# Patient Record
Sex: Female | Born: 1975 | Race: White | Hispanic: No | Marital: Married | Smoking: Never smoker
Health system: Southern US, Community
[De-identification: ages and names within clinical notes are randomized; demographics above are authoritative.]

## PROBLEM LIST (undated history)

## (undated) DIAGNOSIS — I10 Essential (primary) hypertension: Secondary | ICD-10-CM

## (undated) DIAGNOSIS — G2581 Restless legs syndrome: Secondary | ICD-10-CM

## (undated) DIAGNOSIS — M5020 Other cervical disc displacement, unspecified cervical region: Secondary | ICD-10-CM

## (undated) DIAGNOSIS — J45909 Unspecified asthma, uncomplicated: Secondary | ICD-10-CM

---

## 1998-09-14 ENCOUNTER — Inpatient Hospital Stay (HOSPITAL_COMMUNITY): Admission: EM | Admit: 1998-09-14 | Discharge: 1998-09-17 | Payer: Self-pay | Admitting: Urology

## 1998-09-14 ENCOUNTER — Encounter: Payer: Self-pay | Admitting: Urology

## 2000-07-10 ENCOUNTER — Other Ambulatory Visit: Admission: RE | Admit: 2000-07-10 | Discharge: 2000-07-10 | Payer: Self-pay | Admitting: *Deleted

## 2001-08-13 ENCOUNTER — Other Ambulatory Visit: Admission: RE | Admit: 2001-08-13 | Discharge: 2001-08-13 | Payer: Self-pay | Admitting: *Deleted

## 2002-07-29 ENCOUNTER — Encounter: Admission: RE | Admit: 2002-07-29 | Discharge: 2002-07-29 | Payer: Self-pay | Admitting: Family Medicine

## 2002-07-29 ENCOUNTER — Encounter: Payer: Self-pay | Admitting: Family Medicine

## 2002-08-26 ENCOUNTER — Other Ambulatory Visit: Admission: RE | Admit: 2002-08-26 | Discharge: 2002-08-26 | Payer: Self-pay | Admitting: Obstetrics and Gynecology

## 2003-05-07 ENCOUNTER — Other Ambulatory Visit: Admission: RE | Admit: 2003-05-07 | Discharge: 2003-05-07 | Payer: Self-pay | Admitting: Obstetrics and Gynecology

## 2004-04-28 ENCOUNTER — Inpatient Hospital Stay (HOSPITAL_COMMUNITY): Admission: AD | Admit: 2004-04-28 | Discharge: 2004-04-28 | Payer: Self-pay | Admitting: Obstetrics and Gynecology

## 2004-06-06 ENCOUNTER — Encounter: Admission: RE | Admit: 2004-06-06 | Discharge: 2004-06-06 | Payer: Self-pay | Admitting: Obstetrics and Gynecology

## 2004-08-02 ENCOUNTER — Inpatient Hospital Stay (HOSPITAL_COMMUNITY): Admission: AD | Admit: 2004-08-02 | Discharge: 2004-08-06 | Payer: Self-pay | Admitting: Obstetrics & Gynecology

## 2007-06-20 ENCOUNTER — Inpatient Hospital Stay (HOSPITAL_COMMUNITY): Admission: RE | Admit: 2007-06-20 | Discharge: 2007-06-23 | Payer: Self-pay | Admitting: Obstetrics and Gynecology

## 2007-06-20 ENCOUNTER — Encounter (INDEPENDENT_AMBULATORY_CARE_PROVIDER_SITE_OTHER): Payer: Self-pay | Admitting: Obstetrics and Gynecology

## 2010-01-21 ENCOUNTER — Emergency Department (HOSPITAL_COMMUNITY): Admission: EM | Admit: 2010-01-21 | Discharge: 2010-01-21 | Payer: Self-pay | Admitting: Emergency Medicine

## 2010-09-12 NOTE — Op Note (Signed)
[name]               ACCOUNT NO.:  [id number]   MEDICAL RECORD NO.:  [id number]          PATIENT TYPE:  INP   LOCATION:  9105                          FACILITY:  [facility]   PHYSICIAN:  Lenoard Aden, M.D.DATE OF BIRTH:  [birth year]   DATE OF PROCEDURE:  06/20/2007  DATE OF DISCHARGE:                               OPERATIVE REPORT   PREOPERATIVE DIAGNOSES:  1. Thirty-seven-week intrauterine pregnancy.  2. Class A2 diabetes mellitus with poor control.  3. Mature  LS:PG ratio on amniocentesis.   POSTOPERATIVE DIAGNOSES:  1. Thirty-seven-week intrauterine pregnancy.  2. Class A2 diabetes mellitus with poor control.  3. Mature  LS:PG ratio on amniocentesis.  4. Dense pelvic adhesions.   PROCEDURE:  1. Repeat low segment transverse cesarean section.  2. Lysis of adhesions.  3. Tubal ligation.   SURGEON:  Lenoard Aden, M.D.   ASSISTANT:  None.   ANESTHESIA:  Spinal Dr. Jean Rosenthal.   ESTIMATED BLOOD LOSS:  1000 mL.   COMPLICATIONS:  None.   DRAINS:  Foley.   COUNTS:  Correct.   DISPOSITION:  Patient to Recovery in good condition.   FINDINGS:  Full-term living female, occiput-anterior position, vacuum-  assisted delivery x 1 pull, Apgars 8/9.  Placenta was delivered manually  intact from an anterior location.  Normal intrauterine cavity.  Normal  uterus.  Bilateral normal tubes and ovaries.  Tubal segments were sent  to Pathology.   BRIEF OPERATIVE NOTE:  After being apprised of the risks of anesthesia,  infection, bleeding, injury to abdominal organs with need for repair,  delayed versus immediate complications to include bowel or bladder  injury with possible need for repair, failure risk of tubal of 5-10 per  thousand, the patient was brought to the operating room, where she was  administered spinal anesthetic without complications, prepped and draped  in the sterile fashion, Foley catheter placed.  After achieving adequate  anesthesia, dilute Marcaine  solution placed in the area of previous  Pfannenstiel skin incision, which was then made with a scalpel and  carried down to the fascia, which was nicked in the midline and opened  transversely using Mayo scissors.  Rectus muscles were dissected sharply  in the midline.  Upon entering the peritoneal cavity, the omentum was  adhesed to the anterior abdominal wall and bladder flap; this was  released sharply and using the monopolar cautery for hemostasis.  At  this time, bladder flap was densely adherent to the mid lower uterine  segment; this was sharply dissected off the lower uterine segment using  Metzenbaum scissors and bladder blade was therefore placed.  After  clearing the lower uterine segment adequately, a Kerr hysterotomy  incision was made with a scalpel.  Amniotomy of clear fluid was  performed.  Atraumatic delivery using vacuum assistance of full-term  living female, handed the pediatricians and had Apgars of 8 and 9.  Placenta was delivered manually intact with 3-vessel cord.  Uterus was  curetted using a dry lap pack and closed in 1 running layer of 0  Monocryl suture.  Angle sutures were placed for hemostasis at  the left  lateral margin.  Bladder flap was inspected and found to be hemostatic.  Irrigation was accomplished.  The right tube was then traced out to the  fimbriated end; ampullary-isthmic portion of tube was identified and an  avascular portion of the mesosalpinx was identified, creating a window  with monopolar cautery; 0 ties were placed distally and proximally and  tubal segment was excised without difficulty.  At this time, the same  procedure was done on the right tube as done on the left tube.  Tubal  segment was excised.  Good hemostasis was noted.  Tubal lumens were  visualized and cauterized and uterus was replaced in the abdominal  cavity.  Irrigation was accomplished and good hemostasis noted.  At this  time, the bladder flap was inspected and to be  hemostatic.  Fascia is  closed using a 0 Monocryl in continuous running fashion, subcutaneous  tissues was reapproximated using a 2-0 plain in a continuous running  fashion.  Skin was closed using staples.  The patient tolerates the  procedure well and was transferred to Recovery in good condition.      Lenoard Aden, M.D.  Electronically Signed     RJT/MEDQ  D:  06/20/2007  T:  06/21/2007  Job:  [id number]

## 2010-09-12 NOTE — H&P (Signed)
NAME:  [name] NO.:  [id number]   MEDICAL RECORD NO.:  [id number]          PATIENT TYPE:  INP   LOCATION:                                FACILITY:  [facility]   PHYSICIAN:  Lenoard Aden, M.D.     DATE OF BIRTH:   DATE OF ADMISSION:  06/20/2007  DATE OF DISCHARGE:                              HISTORY & PHYSICAL   CHIEF COMPLAINT:  Repeat C-section with a history of C-section x2.   HISTORY OF PRESENT ILLNESS:  She is a 35 year old female, G3, P2 with  history of previous C-section x2 who presents for repeat C-section at 39-  2/7 weeks with late registration for care, poorly-controlled diabetes  and mature amniocentesis with mature LSPG profile.   ALLERGIES:  No known drug allergies.   MEDICATIONS:  Glyburide, Valtrex and prenatal vitamins.   SOCIAL HISTORY:  She is a nonsmoker, nondrinker and denies any domestic  violence.   PAST MEDICAL HISTORY:  1. History of kidney stones.  2. Pyelonephritis.  3. C-section x2.  4. Gestational diabetes requiring medication.   FAMILY HISTORY:  Otherwise noncontributory except for cholesterol issues  and colon cancer.   PHYSICAL EXAMINATION:  GENERAL:  She is an obese, white female in no  acute distress.  HEENT:  Normal.  LUNGS:  Clear.  HEART:  Regular rate and rhythm.  ABDOMEN:  Soft, gravid, nontender.  PELVIC:  Cervix is closed, 3-cm long with presenting part in the pelvis.  EXTREMITIES:  No cords.  NEUROLOGIC:  Nonfocal.  SKIN:  Intact.   IMPRESSION:  1. A 37-week intrauterine pregnancy.  2. Poorly-controlled diabetes with a mature LSPG profile.  3. Late registrant for obstetrical care with poorly-confirmed dates.   PLAN:  Proceed with repeat low-segment transverse cesarean infection.  Risks of anesthesia, infection, bleeding, injury to abdominal organs and  need for repair is discussed.  Labor and its many complications  including bowel and bladder injury are noted.  The patient acknowledges  and  wishes to proceed.      Lenoard Aden, M.D.  Electronically Signed     RJT/MEDQ  D:  06/19/2007  T:  06/20/2007  Job:  [id number]

## 2010-09-12 NOTE — Discharge Summary (Signed)
[name], [name]               ACCOUNT NO.:  [id number]   MEDICAL RECORD NO.:  [id number]          PATIENT TYPE:  INP   LOCATION:  9105                          FACILITY:  [facility]   PHYSICIAN:  Lenoard Aden, M.D.DATE OF BIRTH:  31-May-1975   DATE OF ADMISSION:  06/20/2007  DATE OF DISCHARGE:  06/23/2007                               DISCHARGE SUMMARY   The patient underwent uncomplicated repeat C-section and tubal ligation  status post mature amniocentesis.  She also had lysis of adhesions at  the time of her surgery.   Postoperative course uncomplicated.  Tolerating diet well.   Discharged to home day 3.  Discharge teaching done.  Follow up in the  office within 1 week for staple removal.  Tylox and prenatal vitamins  are given.      Lenoard Aden, M.D.  Electronically Signed     RJT/MEDQ  D:  07/11/2007  T:  07/12/2007  Job:  [id number]

## 2011-01-19 LAB — CBC
HCT: 34.1 — ABNORMAL LOW
Hemoglobin: 11.7 — ABNORMAL LOW
MCHC: 34.3
MCHC: 34.5
MCV: 79
MCV: 79.3
Platelets: 174
Platelets: 226
RBC: 4.32
RDW: 14.9
WBC: 9.8

## 2011-01-19 LAB — BASIC METABOLIC PANEL
BUN: 9
CO2: 20
Calcium: 9
Chloride: 107
Creatinine, Ser: 0.53
GFR calc Af Amer: >60
GFR calc non Af Amer: >60
Glucose, Bld: 82
Potassium: 4.3
Sodium: 137

## 2011-10-12 ENCOUNTER — Other Ambulatory Visit: Payer: Self-pay | Admitting: Obstetrics and Gynecology

## 2014-11-15 ENCOUNTER — Institutional Professional Consult (permissible substitution): Payer: Self-pay | Admitting: Internal Medicine

## 2017-07-03 ENCOUNTER — Emergency Department (HOSPITAL_COMMUNITY)
Admission: EM | Admit: 2017-07-03 | Discharge: 2017-07-04 | Disposition: A | Discharge status: Home / Self Care | Payer: BLUE CROSS/BLUE SHIELD | Attending: Emergency Medicine | Admitting: Emergency Medicine

## 2017-07-03 ENCOUNTER — Emergency Department (HOSPITAL_COMMUNITY): Payer: BLUE CROSS/BLUE SHIELD

## 2017-07-03 ENCOUNTER — Encounter (HOSPITAL_COMMUNITY): Payer: Self-pay | Admitting: *Deleted

## 2017-07-03 ENCOUNTER — Other Ambulatory Visit: Payer: Self-pay

## 2017-07-03 DIAGNOSIS — I10 Essential (primary) hypertension: Secondary | ICD-10-CM | POA: Insufficient documentation

## 2017-07-03 DIAGNOSIS — R1031 Right lower quadrant pain: Secondary | ICD-10-CM | POA: Diagnosis not present

## 2017-07-03 DIAGNOSIS — R110 Nausea: Secondary | ICD-10-CM | POA: Diagnosis not present

## 2017-07-03 HISTORY — DX: Essential (primary) hypertension: I10

## 2017-07-03 HISTORY — DX: Unspecified asthma, uncomplicated: J45.909

## 2017-07-03 HISTORY — DX: Restless legs syndrome: G25.81

## 2017-07-03 HISTORY — DX: Other cervical disc displacement, unspecified cervical region: M50.20

## 2017-07-03 LAB — URINALYSIS, ROUTINE W REFLEX MICROSCOPIC
Bilirubin Urine: NEGATIVE
Glucose, UA: NEGATIVE mg/dL
Hgb urine dipstick: NEGATIVE
Ketones, ur: NEGATIVE mg/dL
Leukocytes, UA: NEGATIVE
Nitrite: NEGATIVE
Protein, ur: NEGATIVE mg/dL
SPECIFIC GRAVITY, URINE: 1.023 (ref 1.005–1.030)
pH: 6 (ref 5.0–8.0)

## 2017-07-03 LAB — COMPREHENSIVE METABOLIC PANEL
ALK PHOS: 51 U/L (ref 38–126)
ALT: 17 U/L (ref 14–54)
ANION GAP: 9 (ref 5–15)
AST: 17 U/L (ref 15–41)
Albumin: 3.8 g/dL (ref 3.5–5.0)
BUN: 12 mg/dL (ref 6–20)
CALCIUM: 9 mg/dL (ref 8.9–10.3)
CO2: 25 mmol/L (ref 22–32)
Chloride: 99 mmol/L — ABNORMAL LOW (ref 101–111)
Creatinine, Ser: 0.64 mg/dL (ref 0.44–1.00)
GFR calc Af Amer: >60 mL/min (ref >60)
GFR calc non Af Amer: >60 mL/min (ref >60)
Glucose, Bld: 93 mg/dL (ref 65–99)
POTASSIUM: 3.8 mmol/L (ref 3.5–5.1)
SODIUM: 133 mmol/L — AB (ref 135–145)
Total Bilirubin: 0.8 mg/dL (ref 0.3–1.2)
Total Protein: 7 g/dL (ref 6.5–8.1)

## 2017-07-03 LAB — CBC
HEMATOCRIT: 38.3 % (ref 36.0–46.0)
HEMOGLOBIN: 12.4 g/dL (ref 12.0–15.0)
MCH: 27.3 pg (ref 26.0–34.0)
MCHC: 32.4 g/dL (ref 30.0–36.0)
MCV: 84.2 fL (ref 78.0–100.0)
Platelets: 259 10*3/uL (ref 150–400)
RBC: 4.55 MIL/uL (ref 3.87–5.11)
RDW: 14.3 % (ref 11.5–15.5)
WBC: 8.3 10*3/uL (ref 4.0–10.5)

## 2017-07-03 LAB — I-STAT BETA HCG BLOOD, ED (MC, WL, AP ONLY)

## 2017-07-03 LAB — LIPASE, BLOOD: Lipase: 22 U/L (ref 11–51)

## 2017-07-03 MED ORDER — FENTANYL CITRATE (PF) 100 MCG/2ML IJ SOLN
50.0000 ug | Freq: Once | INTRAMUSCULAR | Status: AC
  Administered 2017-07-03: 50 ug via INTRAVENOUS
  Filled 2017-07-03: qty 2

## 2017-07-03 MED ORDER — ONDANSETRON HCL 4 MG/2ML IJ SOLN
4.0000 mg | Freq: Once | INTRAMUSCULAR | Status: AC
  Administered 2017-07-03: 4 mg via INTRAVENOUS
  Filled 2017-07-03: qty 2

## 2017-07-03 MED ORDER — OXYCODONE-ACETAMINOPHEN 5-325 MG PO TABS: 1.0000 | ORAL_TABLET | ORAL | 0 refills | Status: AC | PRN

## 2017-07-03 MED ORDER — SODIUM CHLORIDE 0.9 % IV BOLUS (SEPSIS)
1000.0000 mL | Freq: Once | INTRAVENOUS | Status: AC
  Administered 2017-07-03: 1000 mL via INTRAVENOUS

## 2017-07-03 MED ORDER — SODIUM CHLORIDE 0.9 % IJ SOLN
INTRAMUSCULAR | Status: AC
  Filled 2017-07-03: qty 50

## 2017-07-03 MED ORDER — IOPAMIDOL (ISOVUE-300) INJECTION 61%
100.0000 mL | Freq: Once | INTRAVENOUS | Status: AC | PRN
  Administered 2017-07-03: 100 mL via INTRAVENOUS

## 2017-07-03 MED ORDER — IOPAMIDOL (ISOVUE-300) INJECTION 61%
INTRAVENOUS | Status: AC
  Filled 2017-07-03: qty 100

## 2017-07-03 MED ORDER — ONDANSETRON HCL 4 MG PO TABS: 4.0000 mg | ORAL_TABLET | Freq: Three times a day (TID) | ORAL | 0 refills | Status: AC | PRN

## 2017-07-03 NOTE — ED Provider Notes (Signed)
[facility]-EMERGENCY DEPT Provider Note   CSN: [id number] Arrival date & time: 07/03/17  1227     History   Chief Complaint Chief Complaint  Patient presents with  . Abdominal Pain    RLQ    HPI Laura DerryJacinta Decker is a 42 y.o. female.  The history is provided by the patient and medical records. No language interpreter was used.  Abdominal Pain   This is a new problem. The current episode started more than 2 days ago. The problem occurs constantly. The problem has not changed since onset.The pain is associated with an unknown factor. The pain is located in the RLQ. The pain is moderate. Associated symptoms include nausea. Pertinent negatives include fever, diarrhea, hematochezia, melena, vomiting, constipation, dysuria, frequency, hematuria, headaches and myalgias. The symptoms are aggravated by palpation. Nothing relieves the symptoms. Past workup includes ultrasound (negative pelvic US by pt report today). Her past medical history is significant for irritable bowel syndrome.    Past Medical History:  Diagnosis Date  . Hypertension   . Reactive airway disease   . Restless leg syndrome   . Ruptured disc, cervical     There are no active problems to display for this patient.   Past Surgical History:  Procedure Laterality Date  . CESAREAN SECTION      OB History    No data available       Home Medications    Prior to Admission medications   Not on File    Family History No family history on file.  Social History Social History   Tobacco Use  . Smoking status: Never Smoker  . Smokeless tobacco: Never Used  Substance Use Topics  . Alcohol use: No    Frequency: Never  . Drug use: No     Allergies   Patient has no known allergies.   Review of Systems Review of Systems  Constitutional: Negative for chills, diaphoresis, fatigue and fever.  HENT: Negative for congestion and rhinorrhea.   Eyes: Negative for visual disturbance.    Respiratory: Negative for cough, chest tightness, shortness of breath, wheezing and stridor.   Cardiovascular: Negative for chest pain and palpitations.  Gastrointestinal: Positive for abdominal pain and nausea. Negative for abdominal distention, constipation, diarrhea, hematochezia, melena and vomiting.  Genitourinary: Negative for dysuria, flank pain, frequency, genital sores, hematuria, pelvic pain, vaginal bleeding, vaginal discharge and vaginal pain.  Musculoskeletal: Negative for back pain, myalgias, neck pain and neck stiffness.  Skin: Negative for rash and wound.  Neurological: Negative for weakness, light-headedness, numbness and headaches.  Psychiatric/Behavioral: Negative for agitation and confusion.  All other systems reviewed and are negative.    Physical Exam Updated Vital Signs BP (!) 156/100 (BP Location: Left Arm)   Pulse 85   Temp 98.1 F (36.7 C) (Oral)   Ht 5' (1.524 m)   Wt 97.5 kg (215 lb)   SpO2 97%   BMI 41.99 kg/m   Physical Exam  Constitutional: She is oriented to person, place, and time. She appears well-developed and well-nourished.  Non-toxic appearance. She does not appear ill. No distress.  HENT:  Head: Normocephalic and atraumatic.  Mouth/Throat: No oropharyngeal exudate.  Eyes: Conjunctivae and EOM are normal. Pupils are equal, round, and reactive to light.  Neck: Normal range of motion.  Cardiovascular: Normal rate and intact distal pulses.  No murmur heard. Pulmonary/Chest: Effort normal. No respiratory distress. She has no rales. She exhibits no tenderness.  Abdominal: Soft. Normal appearance and bowel sounds are  normal. She exhibits no distension. There is tenderness in the right lower quadrant. There is no rigidity, no rebound, no guarding and no CVA tenderness.    Musculoskeletal: She exhibits no edema or tenderness.  Neurological: She is alert and oriented to person, place, and time. No sensory deficit. She exhibits normal muscle tone.   Skin: Capillary refill takes less than 2 seconds. No rash noted. She is not diaphoretic.  Psychiatric: She has a normal mood and affect.  Nursing note and vitals reviewed.    ED Treatments / Results  Labs (all labs ordered are listed, but only abnormal results are displayed) Labs Reviewed  COMPREHENSIVE METABOLIC PANEL - Abnormal; Notable for the following components:      Result Value   Sodium 133 (*)    Chloride 99 (*)    All other components within normal limits  URINALYSIS, ROUTINE W REFLEX MICROSCOPIC - Abnormal; Notable for the following components:   APPearance CLOUDY (*)    All other components within normal limits  LIPASE, BLOOD  CBC  I-STAT BETA HCG BLOOD, ED ([facility], [facility], [facility] ONLY)    EKG  EKG Interpretation None       Radiology Ct Abdomen Pelvis W Contrast  Result Date: 07/03/2017 CLINICAL DATA:  Right lower quadrant pain since Monday. EXAM: CT ABDOMEN AND PELVIS WITH CONTRAST TECHNIQUE: Multidetector CT imaging of the abdomen and pelvis was performed using the standard protocol following bolus administration of intravenous contrast. CONTRAST:  ISOVUE-300 IOPAMIDOL (ISOVUE-300) INJECTION 61% COMPARISON:  None. FINDINGS: Lower chest: Normal size heart. No active pulmonary disease. Minimal bibasilar atelectasis. Hepatobiliary: Mild hepatic steatosis. Normal gallbladder. No mass or biliary dilatation. Pancreas: Normal Spleen: Normal Adrenals/Urinary Tract: Normal bilateral adrenal glands. 5 mm nonobstructing right-sided renal calculus. No hydroureteronephrosis. Nondistended urinary bladder without focal mural thickening or calculus. Stomach/Bowel: Nondistended stomach with normal small bowel rotation. No small bowel inflammation or obstruction. No pericecal inflammation. What appears to be the appendix is diminutive. Vascular/Lymphatic: No significant vascular findings are present. No enlarged abdominal or pelvic lymph nodes. Reproductive: Dominant follicle is noted  measuring 2.1 cm in the right ovary. No adnexal mass is seen. The uterus is unremarkable. Other: No free air nor free fluid. Musculoskeletal: No acute or significant osseous findings. IMPRESSION: 1. 5 mm nonobstructing right renal calculus. No hydroureteronephrosis. 2. No bowel obstruction, inflammation or evidence of acute appendicitis. 3. Dominant follicle is noted of the right ovary measuring 2.1 cm. 4. Hepatic steatosis. Electronically Signed   By: Tollie Eth M.D.   On: 07/03/2017 22:32    Procedures Procedures (including critical care time)  Medications Ordered in ED Medications  iopamidol (ISOVUE-300) 61 % injection (not administered)  sodium chloride 0.9 % injection (not administered)  ondansetron (ZOFRAN) injection 4 mg (4 mg Intravenous Given 07/03/17 2150)  sodium chloride 0.9 % bolus 1,000 mL (0 mLs Intravenous Stopped 07/03/17 2339)  fentaNYL (SUBLIMAZE) injection 50 mcg (50 mcg Intravenous Given 07/03/17 2152)  iopamidol (ISOVUE-300) 61 % injection 100 mL (100 mLs Intravenous Contrast Given 07/03/17 2208)     Initial Impression / Assessment and Plan / ED Course  I have reviewed the triage vital signs and the nursing notes.  Pertinent labs & imaging results that were available during my care of the patient were reviewed by me and considered in my medical decision making (see chart for details).     [name] is a 42 y.o. female with a past medical history significant for kidney stones, hypertension, irritable bowel syndrome, who  presents with pain, nausea, and right flank pain.  Patient reports that she has had symptoms for the last 3 days.  She would see her PCP today who ordered a pelvic ultrasound which did not show evidence of torsion by patient report.  Patient was then told to come to the ED by her PCP so she can get a CT to rule out appendicitis.  Patient describes a sharp and aching right lower quadrant abdominal pain.  She reports it feels different than prior kidney stone  pain.  She reports it does not radiate to her back.  She denies any conservation, diarrhea, urinary symptoms, vaginal bleeding, or vaginal discharge.  She denies any pelvic pain during her ultrasound and exam.  Patient denies fevers, chills, chest pain, shortness of breath, palpitations, or cough.  She reports no trauma and has no history of gallbladder or appendix problems.  No history of diverticulitis.  On exam, patient had mild tenderness in her right lower quadrant.  Patient had clear lungs.  No CVA tenderness.  Minimal right flank tenderness.  Legs were nonedematous and patient overall appears well.  Patient given pain medications, nausea medicine, and fluids.  Laboratory testing was ordered and was overall reassuring as seen above.  CT scan was ordered to rule out appendicitis.  Next  CT scan showed no significant evidence of appendicitis, diverticulitis, or obstruction.  No evidence of colitis.  There was a right follicle present on her ovary which may be contributing to her symptoms.  Given the report of no torsion, and lack of pelvic symptoms, do not feel patient needs repeat ultrasound or pelvic exam at this time.  Patient reports her pain and nausea improved with medications.  Patient was given prescription for pain medicine and nausea medicine and follow-up with her PCP and her OB/GYN.  Patient understood return precautions for new or worsened symptoms.  Patient had no other questions or concerns and was discharged in good condition after she was able to tolerate p.o.                     Final Clinical Impressions(s) / ED Diagnoses   Final diagnoses:  Right lower quadrant abdominal pain  Nausea    ED Discharge Orders        Ordered    oxyCODONE-acetaminophen (PERCOCET/ROXICET) 5-325 MG tablet  Every 4 hours PRN     07/03/17 2254    ondansetron (ZOFRAN) 4 MG tablet  Every 8 hours PRN     07/03/17 2254     Clinical Impression: 1. Right lower quadrant abdominal pain   2. Nausea      Disposition: Discharge  Condition: Good  I have discussed the results, Dx and Tx plan with the pt(& family if present). He/she/they expressed understanding and agree(s) with the plan. Discharge instructions discussed at great length. Strict return precautions discussed and pt &/or family have verbalized understanding of the instructions. No further questions at time of discharge.    New Prescriptions   ONDANSETRON (ZOFRAN) 4 MG TABLET    Take 1 tablet (4 mg total) by mouth every 8 (eight) hours as needed for nausea or vomiting.   OXYCODONE-ACETAMINOPHEN (PERCOCET/ROXICET) 5-325 MG TABLET    Take 1 tablet by mouth every 4 (four) hours as needed for severe pain.    Follow Up: Marva Panda, NP [facility] [address] [place] [address] [phone]     [facility] [address] [place] [id number] [address]  [place] [address] [phone]       Keyoni Lapinski, Canary Brim, MD 07/04/17 [phone]

## 2017-07-03 NOTE — ED Triage Notes (Signed)
Rt lower quad pain since Monday had an ultra sound yesterday to rule ou tovarian cyst, neg results. Pt has history of kidney stones, she is concerned about her appendix

## 2017-07-03 NOTE — Discharge Instructions (Signed)
Your imaging today did not show evidence of obstruction, diverticulitis, colitis, or appendicitis.  There was evidence of a follicle in the right ovary that may be contributing to your pain.  Given your reportedly reassuring ultrasound today showing no evidence of torsion, we feel you are safe for discharge home.  Please use the medicines to help with your symptoms and follow-up with your primary doctor and your OB/GYN in the next few days.  If any symptoms change or worsen, please return to the nearest emergency department.

## 2017-07-08 ENCOUNTER — Other Ambulatory Visit: Payer: Self-pay | Admitting: *Deleted

## 2017-07-08 DIAGNOSIS — R1031 Right lower quadrant pain: Secondary | ICD-10-CM

## 2018-09-30 ENCOUNTER — Encounter: Payer: Self-pay | Admitting: Internal Medicine

## 2018-09-30 ENCOUNTER — Ambulatory Visit: Payer: BC Managed Care – PPO | Admitting: Internal Medicine

## 2018-09-30 ENCOUNTER — Other Ambulatory Visit: Payer: Self-pay

## 2018-09-30 ENCOUNTER — Ambulatory Visit (INDEPENDENT_AMBULATORY_CARE_PROVIDER_SITE_OTHER): Payer: BLUE CROSS/BLUE SHIELD

## 2018-09-30 VITALS — BP 136/88 | HR 87 | Temp 98.3°F | Ht 60.0 in | Wt 226.0 lb

## 2018-09-30 DIAGNOSIS — R05 Cough: Secondary | ICD-10-CM

## 2018-09-30 DIAGNOSIS — R058 Other specified cough: Secondary | ICD-10-CM

## 2018-09-30 LAB — CBC WITH DIFFERENTIAL/PLATELET
Basophils Absolute: 0 10*3/uL (ref 0.0–0.1)
Basophils Relative: 0.3 % (ref 0.0–3.0)
Eosinophils Absolute: 0.1 10*3/uL (ref 0.0–0.7)
Eosinophils Relative: 1.6 % (ref 0.0–5.0)
HCT: 38 % (ref 36.0–46.0)
Hemoglobin: 12.8 g/dL (ref 12.0–15.0)
Lymphocytes Relative: 24.3 % (ref 12.0–46.0)
Lymphs Abs: 1.7 10*3/uL (ref 0.7–4.0)
MCHC: 33.6 g/dL (ref 30.0–36.0)
MCV: 82 fl (ref 78.0–100.0)
Monocytes Absolute: 0.3 10*3/uL (ref 0.1–1.0)
Monocytes Relative: 3.9 % (ref 3.0–12.0)
Neutro Abs: 4.9 10*3/uL (ref 1.4–7.7)
Neutrophils Relative %: 69.9 % (ref 43.0–77.0)
Platelets: 225 10*3/uL (ref 150.0–400.0)
RBC: 4.64 Mil/uL (ref 3.87–5.11)
RDW: 14.7 % (ref 11.5–15.5)
WBC: 7 10*3/uL (ref 4.0–10.5)

## 2018-09-30 MED ORDER — PANTOPRAZOLE SODIUM 40 MG PO TBEC: 40.0000 mg | DELAYED_RELEASE_TABLET | Freq: Every day | ORAL | 2 refills | Status: AC

## 2018-09-30 MED ORDER — BENZONATATE 200 MG PO CAPS
200.0000 mg | ORAL_CAPSULE | Freq: Three times a day (TID) | ORAL | 1 refills | Status: DC | PRN
Start: 2018-09-30 — End: 2019-08-28

## 2018-09-30 NOTE — Patient Instructions (Signed)
Protonix 40 mg Take 30- 60 min before your first and last meals of the day   Stop the inhalers   For cough take tessalon 200 mg every 6-8 hours for at least and stop clearing the throat  GERD (REFLUX)  is an extremely common cause of respiratory symptoms just like yours , many times with no obvious heartburn at all.    It can be treated with medication, but also with lifestyle changes including elevation of the head of your bed (ideally with 6 -8inch blocks under the headboard of your bed),  Smoking cessation, avoidance of late meals, excessive alcohol, and avoid fatty foods, chocolate, peppermint, colas, red wine, and acidic juices such as orange juice.  NO MINT OR MENTHOL PRODUCTS SO NO COUGH DROPS  USE SUGARLESS CANDY INSTEAD (Jolley ranchers or Stover's or Life Savers) or even ice chips will also do - the key is to swallow to prevent all throat clearing. NO OIL BASED VITAMINS - use powdered substitutes.  Avoid fish oil when coughing.   Please remember to go to the lab and x-ray department   for your tests - we will call you with the results when they are available.    Please schedule a follow up office visit in 4 weeks, sooner if needed

## 2018-09-30 NOTE — Progress Notes (Signed)
[name], female    DOB: 10/10/1975,    MRN: [id number]   Brief patient profile:  42 yowf never smoker no major resp /allergy issues as child  Then around 2010 noted coughs lasted longer with URI's then around 2015 more chronic daily variably productive cough that has waxed and waned since then so referred to pulmonary clinic 09/30/2018 by [facility]   Last iup was 2009 and baseline 190      History of Present Illness  09/30/2018  Pulmonary/ 1st office eval/Raneen Jaffer  Chief Complaint  Patient presents with  . Cough  Dyspnea:  Not limited by breathing from desired activities / hills / steps can be an issue she attributes to wt gain s cough  Cough: day > noct  More productive in am x 1 tbsp clear to dark mustard Sleep: not usually waking on flat bed 2 pillows  SABA use: not sure helps/ prednisone not sure So severe sees stars and incont assoc with gen chest tightness during severe fits, no pleuritic cp Never tried  zyrtec  Much worse on lisonopril    No obvious day to day or daytime variability or assoc purulent sputum or mucus plugs or hemoptysis  subjective wheeze or overt sinus or hb symptoms.   Sleeping as above without nocturnal  or early am exacerbation  of respiratory  c/o's or need for noct saba. Also denies any obvious fluctuation of symptoms with weather or environmental changes or other aggravating or alleviating factors except as outlined above   No unusual exposure hx or h/o childhood pna/ asthma or knowledge of premature birth.  Current Allergies, Complete Past Medical History, Past Surgical History, Family History, and Social History were reviewed in [employer] record.  ROS  The following are not active complaints unless bolded Hoarseness, sore throat, dysphagia, dental problems, itching, sneezing,  nasal congestion or discharge of excess mucus or purulent secretions, ear ache,   fever, chills, sweats, unintended wt loss or wt gain, classically pleuritic or  exertional cp,  orthopnea pnd or arm/hand swelling  or leg swelling, presyncope, palpitations, abdominal pain, anorexia, nausea, vomiting, diarrhea  or change in bowel habits or change in bladder habits, change in stools or change in urine, dysuria, hematuria,  rash, arthralgias, visual complaints, headache, numbness, weakness or ataxia or problems with walking or coordination,  change in mood or  memory.           Past Medical History:  Diagnosis Date  . Hypertension   . Reactive airway disease   . Restless leg syndrome   . Ruptured disc, cervical     Outpatient Medications Prior to Visit  Medication Sig Dispense Refill  . hydrochlorothiazide (MICROZIDE) 12.5 MG capsule Take 12.5 mg by mouth daily.    Marland Kitchen losartan (COZAAR) 50 MG tablet Take 50 mg by mouth daily.    . meloxicam (MOBIC) 15 MG tablet Take 15 mg by mouth daily. with food  1  . pramipexole (MIRAPEX) 0.125 MG tablet TAKE 1 TABLET FOR 7 DAYS. THEN TAKE 2 TABLETS FOR 7 DAYS. THEN TAKE 4 TABLETS DAILY. AT BEDTIME.  0  . SYMBICORT 80-4.5 MCG/ACT inhaler TAKE 2 PUFFS BY MOUTH TWICE A DAY  5  . valACYclovir (VALTREX) 500 MG tablet TAKE 1 TAB BY MOUTH TWICE A DAY FOR 2 DAYS AS NEEDED FOR EACH OUTBREAK  3  . VENTOLIN HFA 108 (90 Base) MCG/ACT inhaler TAKE 2 PUFFS BY MOUTH EVERY 4 HOURS AS NEEDED SOB AND WHEEZING  1  .  losartan-hydrochlorothiazide (HYZAAR) 50-12.5 MG tablet Take 1 tablet by mouth daily.  1  . ondansetron (ZOFRAN) 4 MG tablet Take 1 tablet (4 mg total) by mouth every 8 (eight) hours as needed for nausea or vomiting. (Patient not taking: Reported on 09/30/2018) 12 tablet 0  . oxyCODONE-acetaminophen (PERCOCET/ROXICET) 5-325 MG tablet Take 1 tablet by mouth every 4 (four) hours as needed for severe pain. (Patient not taking: Reported on 09/30/2018) 6 tablet 0  . pramipexole (MIRAPEX) 0.5 MG tablet Take 1 tablet by mouth at bedtime.        Objective:     BP 136/88 (BP Location: Left Arm, Patient Position: Sitting, Cuff Size:  Normal)   Pulse 87   Temp 98.3 F (36.8 C)   Ht 5' (1.524 m)   Wt 226 lb (102.5 kg)   SpO2 98%   BMI 44.14 kg/m   SpO2: 98 % RA  Obese wm female cough on insp even with empty symbicort device  HEENT: nl dentition, turbinates bilaterally, and oropharynx. Nl external ear canals without cough reflex   NECK :  without JVD/Nodes/TM/ nl carotid upstrokes bilaterally   LUNGS: no acc muscle use,  Nl contour chest which is clear to A and P bilaterally without cough on insp or exp maneuvers   CV:  RRR  no s3 or murmur or increase in P2, and no edema   ABD:  Quite obese nontender with nl inspiratory excursion in the supine position. No bruits or organomegaly appreciated, bowel sounds nl  MS:  Nl gait/ ext warm without deformities, calf tenderness, cyanosis or clubbing No obvious joint restrictions   SKIN: warm and dry without lesions    NEURO:  alert, approp, nl sensorium with  no motor or cerebellar deficits apparent.     CXR PA and Lateral:   09/30/2018 :    I personally reviewed images and agree with radiology impression as follows:    No active cardiopulmonary disease.  Labs ordered 09/30/2018  Allergy profile        Assessment   Upper airway cough syndrome Onset 2015 assoc with progressive wt gain from baseline 190 - 09/30/2018  After extensive coaching inhaler device,  effectiveness =  0% > severe cough even with empty device so d/c'd all inhalers - Allergy profile 09/30/2018 >  Eos 0.1 /  IgE pending    Failure to respond to rx for asthma with cough on inspiration strongly favors ;Upper airway cough syndrome (previously labeled PNDS),  is so named because it's frequently impossible to sort out how much is  CR/sinusitis with freq throat clearing (which can be related to primary GERD)   vs  causing  secondary (" extra esophageal")  GERD from wide swings in gastric pressure that occur with throat clearing, often  promoting self use of mint and menthol lozenges that reduce the  lower esophageal sphincter tone and exacerbate the problem further in a cyclical fashion.   These are the same pts (now being labeled as having "irritable larynx syndrome" by some cough centers) who not infrequently have a history of having failed to tolerate ace inhibitors(as is the case here),  dry powder inhalers or even any form of ICS (as is the case here)  or biphosphonates or report having atypical/extraesophageal reflux symptoms that don't respond to standard doses of PPI(which may prove to be the case here)  and are easily confused as having aecopd or asthma flares by even experienced allergists/ pulmonologists (myself included).    Of the  three most common causes of  Sub-acute / recurrent or chronic cough, only one (GERD)  can actually contribute to/ trigger  the other two (asthma and post nasal drip syndrome)  and perpetuate the cylce of cough.  While not intuitively obvious, many patients with chronic low grade reflux do not cough until there is a primary insult that disturbs the protective epithelial barrier and exposes sensitive nerve endings.   This is typically viral but can due to PNDS and  either may apply here.   The point is that once this occurs, it is difficult to eliminate the cycle  using anything but a maximally effective acid suppression regimen at least in the short run, accompanied by an appropriate diet to address non acid GERD and control / eliminate the cough itself for at least 5 days by regular doses of tessalon 200 mg and if needed add short course opioid regimen.  Advised: The standardized cough guidelines published in Chest by [facility] Fallsich[facility] Irwin in 2006 are still the best available and consist of a multiple step process (up to 12!) , not a single office visit,  and are intended  to address this problem logically,  with an alogrithm dependent on response to empiric treatment at  each progressive step  to determine a specific diagnosis with  minimal addtional testing needed.  Therefore if adherence is an issue or can't be accurately verified,  it's very unlikely the standard evaluation and treatment will be successful here.    Furthermore, response to therapy (other than acute cough suppression, which should only be used short term with avoidance of narcotic containing cough syrups if possible), can be a gradual process for which the patient is not likely to  perceive immediate benefit.  Unlike going to an eye doctor where the best perscription is almost always the first one and is immediately effective, this is almost never the case in the management of chronic cough syndromes. Therefore the patient needs to commit up front to consistently adhere to recommendations  for up to 6 weeks of therapy directed at the likely underlying problem(s) before the response can be reasonably evaluated.   >>>> F/U IN 4 WEEKS with all meds in hand using a trust but verify approach to confirm accurate Medication  Reconciliation The principal here is that until we are certain that the  patients are doing what we've asked, it makes no sense to ask them to do more.     Morbid (severe) obesity due to excess calories (HCC) Body mass index is 44.14 kg/m.     No results found for: TSH   Contributing to gerd risk/ doe/reviewed the need and the process to achieve and maintain neg calorie balance > defer f/u primary care including intermittently monitoring thyroid status        Total time devoted to counseling  > 50 % of initial 60 min office visit:  reviewde case with pt/  device teaching which extended face to face time for this visit  discussion of options/alternatives/ personally creating written customized instructions  in presence of pt  then going over those specific  Instructions directly with the pt including how to use all of the meds but in particular covering each new medication in detail and the difference between the maintenance= "automatic" meds and the prns using an action plan  format for the latter (If this problem/symptom => do that organization reading Left to right).  Please see AVS from this visit for a full list of these  instructions which I personally wrote for this pt and  are unique to this visit.   Sandrea Hughs, MD 09/30/2018

## 2018-10-01 ENCOUNTER — Encounter: Payer: Self-pay | Admitting: Internal Medicine

## 2018-10-01 ENCOUNTER — Telehealth: Payer: Self-pay | Admitting: Internal Medicine

## 2018-10-01 LAB — RESPIRATORY ALLERGY PROFILE REGION II ~~LOC~~

## 2018-10-01 LAB — INTERPRETATION:

## 2018-10-01 NOTE — Telephone Encounter (Signed)
Patient is returning [place] phone call.  Patient phone number is [phone].

## 2018-10-01 NOTE — Telephone Encounter (Signed)
Medication name and strength: Protonix 40mg   Provider: [facility]: [facility] [place] Patient insurance ID: [id number] W Phone: [phone] Fax: [phone]  Was the PA started on CMM?  Yes If yes, please enter the Key: [id number] Timeframe for approval/denial: Up to 72 hours.   Will route to [place] for follow up.

## 2018-10-01 NOTE — Assessment & Plan Note (Addendum)
Onset 2015 assoc with progressive wt gain from baseline 190 - 09/30/2018  After extensive coaching inhaler device,  effectiveness =  0% > severe cough even with empty device so d/c'd all inhalers - Allergy profile 09/30/2018 >  Eos 0.1 /  IgE   Failure to respond to rx for asthma with cough on inspiration strongly favors ;Upper airway cough syndrome (previously labeled PNDS),  is so named because it's frequently impossible to sort out how much is  CR/sinusitis with freq throat clearing (which can be related to primary GERD)   vs  causing  secondary (" extra esophageal")  GERD from wide swings in gastric pressure that occur with throat clearing, often  promoting self use of mint and menthol lozenges that reduce the lower esophageal sphincter tone and exacerbate the problem further in a cyclical fashion.   These are the same pts (now being labeled as having "irritable larynx syndrome" by some cough centers) who not infrequently have a history of having failed to tolerate ace inhibitors(as is the case here),  dry powder inhalers or even any form of ICS (as is the case here)  or biphosphonates or report having atypical/extraesophageal reflux symptoms that don't respond to standard doses of PPI(which may prove to be the case here)  and are easily confused as having aecopd or asthma flares by even experienced allergists/ pulmonologists (myself included).    Of the three most common causes of  Sub-acute / recurrent or chronic cough, only one (GERD)  can actually contribute to/ trigger  the other two (asthma and post nasal drip syndrome)  and perpetuate the cylce of cough.  While not intuitively obvious, many patients with chronic low grade reflux do not cough until there is a primary insult that disturbs the protective epithelial barrier and exposes sensitive nerve endings.   This is typically viral but can due to PNDS and  either may apply here.   The point is that once this occurs, it is difficult to eliminate  the cycle  using anything but a maximally effective acid suppression regimen at least in the short run, accompanied by an appropriate diet to address non acid GERD and control / eliminate the cough itself for at least 5 days by regular doses of tessalon 200 mg and if needed add short course opioid regimen.  Advised: The standardized cough guidelines published in Chest by [facility] Fallsich[facility] Irwin in 2006 are still the best available and consist of a multiple step process (up to 12!) , not a single office visit,  and are intended  to address this problem logically,  with an alogrithm dependent on response to empiric treatment at  each progressive step  to determine a specific diagnosis with  minimal addtional testing needed. Therefore if adherence is an issue or can't be accurately verified,  it's very unlikely the standard evaluation and treatment will be successful here.    Furthermore, response to therapy (other than acute cough suppression, which should only be used short term with avoidance of narcotic containing cough syrups if possible), can be a gradual process for which the patient is not likely to  perceive immediate benefit.  Unlike going to an eye doctor where the best perscription is almost always the first one and is immediately effective, this is almost never the case in the management of chronic cough syndromes. Therefore the patient needs to commit up front to consistently adhere to recommendations  for up to 6 weeks of therapy directed at the likely underlying problem(s)  before the response can be reasonably evaluated.    >>>> F/U IN 4 WEEKS with all meds in hand using a trust but verify approach to confirm accurate Medication  Reconciliation The principal here is that until we are certain that the  patients are doing what we've asked, it makes no sense to ask them to do more.     Total time devoted to counseling  > 50 % of initial 60 min office visit:  reviewde case with pt/  device teaching  which extended face to face time for this visit  discussion of options/alternatives/ personally creating written customized instructions  in presence of pt  then going over those specific  Instructions directly with the pt including how to use all of the meds but in particular covering each new medication in detail and the difference between the maintenance= "automatic" meds and the prns using an action plan format for the latter (If this problem/symptom => do that organization reading Left to right).  Please see AVS from this visit for a full list of these instructions which I personally wrote for this pt and  are unique to this visit.

## 2018-10-01 NOTE — Assessment & Plan Note (Signed)
Body mass index is 44.14 kg/m.    No results found for: TSH   Contributing to gerd risk/ doe/reviewed the need and the process to achieve and maintain neg calorie balance > defer f/u primary care including intermittently monitoring thyroid status

## 2018-10-01 NOTE — Telephone Encounter (Signed)
See lab results encounter. This encounter is for a medication PA.

## 2018-10-01 NOTE — Progress Notes (Signed)
lmtcb

## 2018-10-03 NOTE — Telephone Encounter (Signed)
ATC pt regarding lab results. LMTCB.

## 2018-10-03 NOTE — Telephone Encounter (Signed)
Pt returning call for lab results can be reached @ [phone].Caren Griffins

## 2018-10-08 NOTE — Telephone Encounter (Signed)
PA for Protonix has been approved. Pharmacy is aware. Will close this encounter.

## 2018-10-29 ENCOUNTER — Ambulatory Visit: Payer: Self-pay | Admitting: Internal Medicine

## 2019-02-02 IMAGING — CT CT ABD-PELV W/ CM
2 of 4 series · 17 of 46 positions shown, 19 images · IV contrast (ISOVUE)
Comparison: None.

CLINICAL DATA: Right lower quadrant pain since [REDACTED].

EXAM:
CT ABDOMEN AND PELVIS WITH CONTRAST
TECHNIQUE: Multidetector CT imaging of the abdomen and pelvis was performed
using the standard protocol following bolus administration of
intravenous contrast.
CONTRAST:  100mL [id number] IOPAMIDOL ([id number]-NZZ) INJECTION 61%

[Series 2: axial st · axial · 0.95mm/px · z∈[+1040,+1445]mm · 14 of 91 slices shown, 16 images]
[im 5/91  soft-tissue]
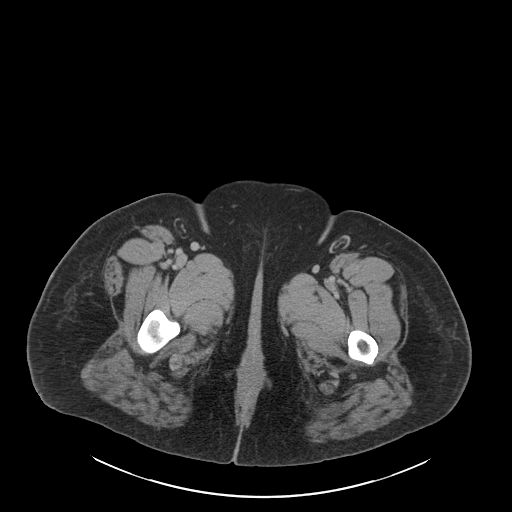
[im 5/91  bone]
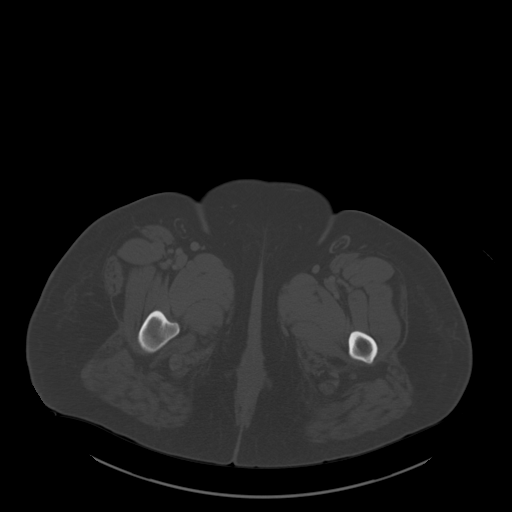
[im 14/91  soft-tissue]
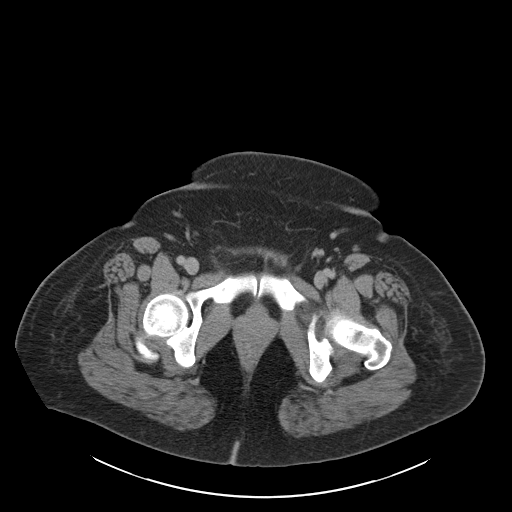
[im 19/91  soft-tissue]
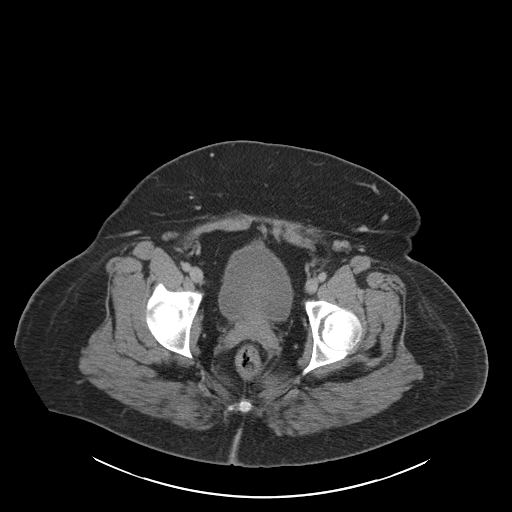
[im 23/91  soft-tissue]
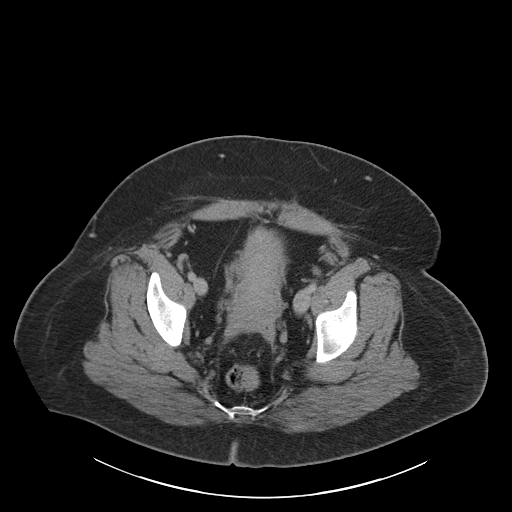
[im 32/91  soft-tissue]
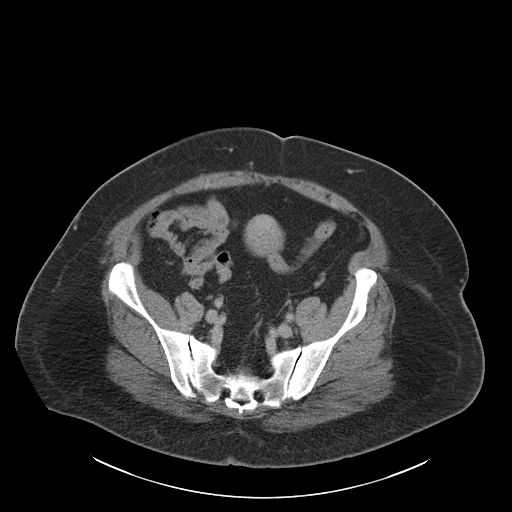
[im 37/91  soft-tissue]
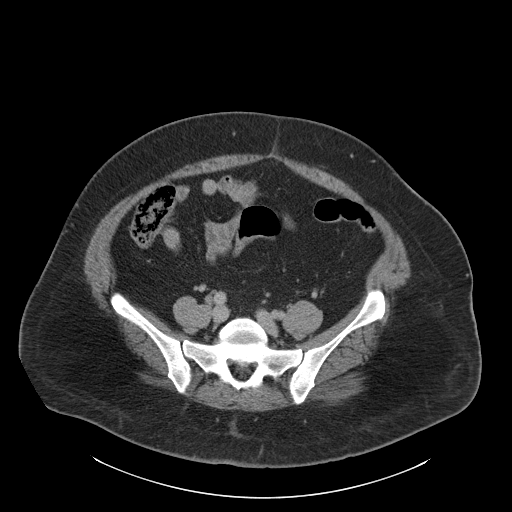
[im 41/91  soft-tissue]
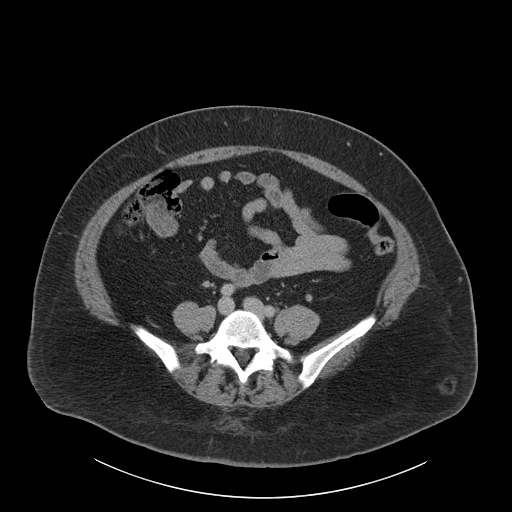
[im 50/91  soft-tissue]
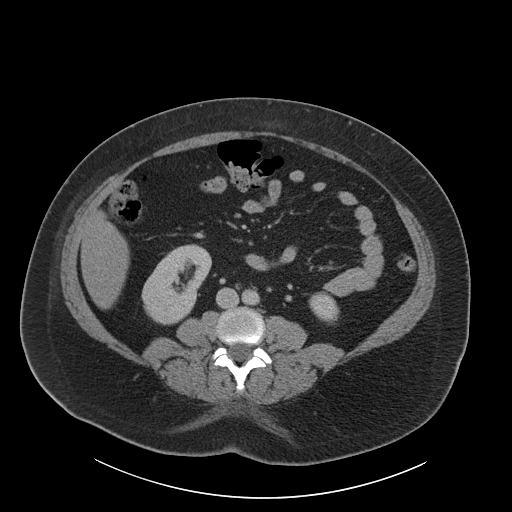
[im 55/91  soft-tissue]
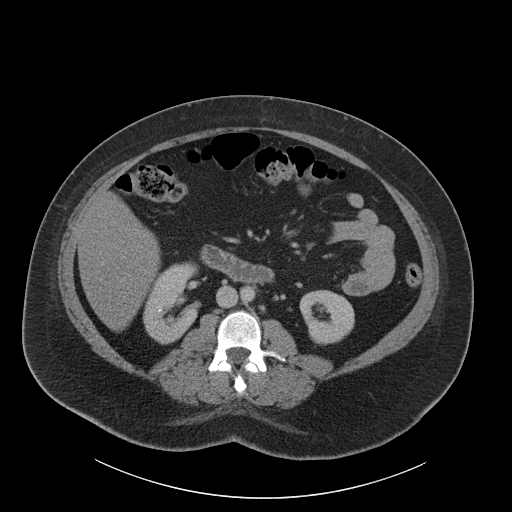
[im 55/91  bone]
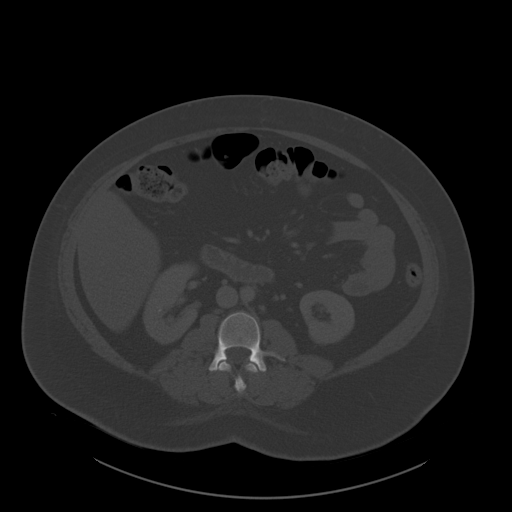
[im 59/91  soft-tissue]
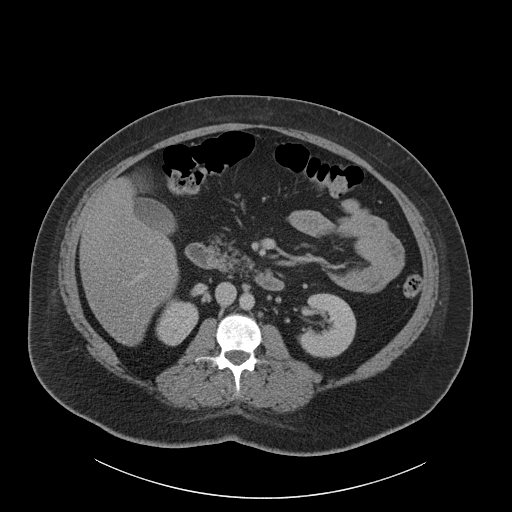
[im 68/91  soft-tissue]
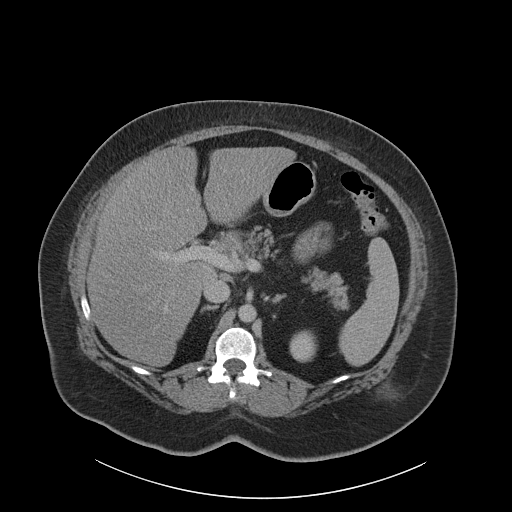
[im 73/91  soft-tissue]
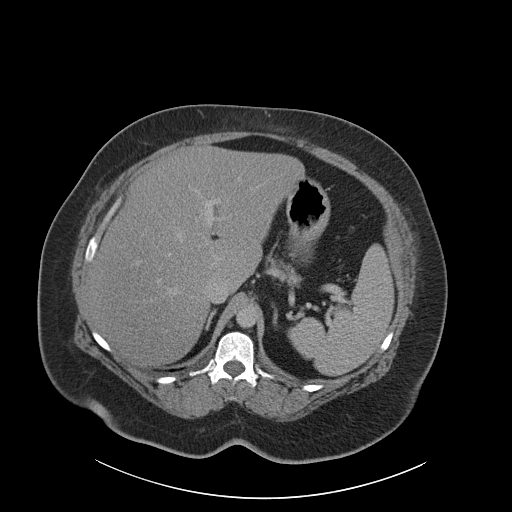
[im 77/91  soft-tissue]
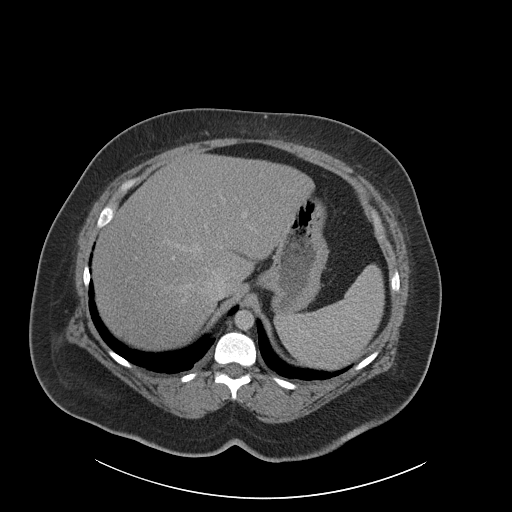
[im 86/91  soft-tissue]
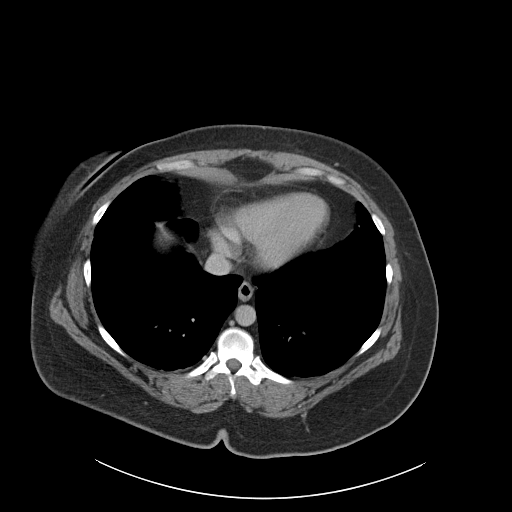

[Series 5: coronal st · coronal · 0.86mm/px · 3 of 122 slices shown]
[im 41/122  soft-tissue]
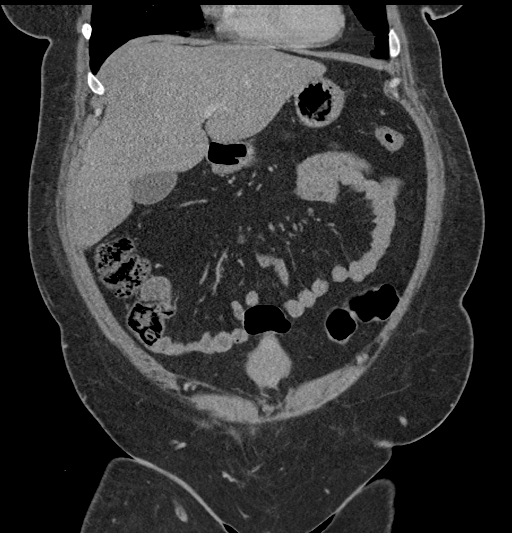
[im 54/122  soft-tissue]
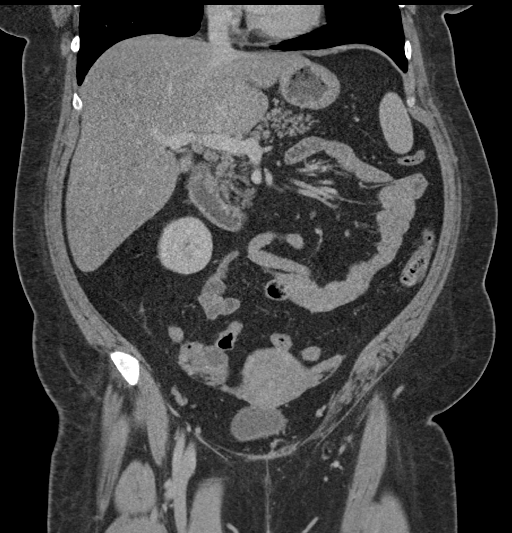
[im 68/122  soft-tissue]
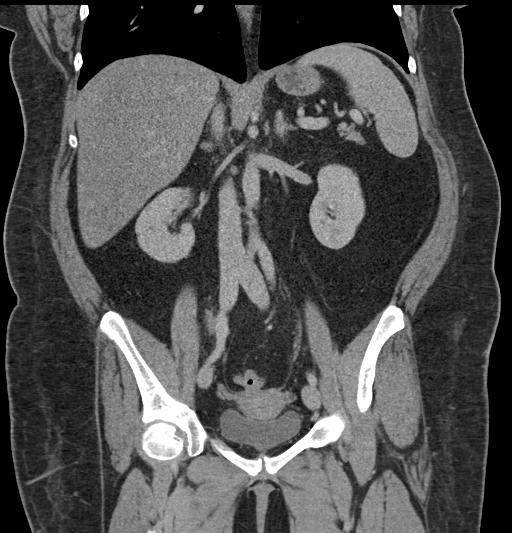

[17 of 46 positions shown; findings below may reference images not displayed]

FINDINGS: Lower chest: Normal size heart. No active pulmonary disease. Minimal
bibasilar atelectasis.

Hepatobiliary: Mild hepatic steatosis. Normal gallbladder. No mass
or biliary dilatation.

Pancreas: Normal

Spleen: Normal

Adrenals/Urinary Tract: Normal bilateral adrenal glands. 5 mm
nonobstructing right-sided renal calculus. No hydroureteronephrosis.
Nondistended urinary bladder without focal mural thickening or
calculus.

Stomach/Bowel: Nondistended stomach with normal small bowel
rotation. No small bowel inflammation or obstruction. No pericecal
inflammation. What appears to be the appendix is diminutive.

Vascular/Lymphatic: No significant vascular findings are present. No
enlarged abdominal or pelvic lymph nodes.

Reproductive: Dominant follicle is noted measuring 2.1 cm in the
right ovary. No adnexal mass is seen. The uterus is unremarkable.

Other: No free air nor free fluid.

Musculoskeletal: No acute or significant osseous findings.
IMPRESSION: 1. 5 mm nonobstructing right renal calculus. No
hydroureteronephrosis.
2. No bowel obstruction, inflammation or evidence of acute
appendicitis.
3. Dominant follicle is noted of the right ovary measuring 2.1 cm.
4. Hepatic steatosis.

## 2019-08-27 ENCOUNTER — Other Ambulatory Visit: Payer: Self-pay | Admitting: Internal Medicine

## 2019-08-27 NOTE — Telephone Encounter (Signed)
Never followed up as planned for chronic cough so ok x one but must make f/u ov with all meds in hand or no further phone care can be done

## 2019-08-27 NOTE — Telephone Encounter (Signed)
Dr. Wert, please advise if you are okay with us refilling med for pt. 

## 2019-09-01 ENCOUNTER — Other Ambulatory Visit: Payer: Self-pay | Admitting: Internal Medicine

## 2019-12-10 ENCOUNTER — Other Ambulatory Visit: Payer: Self-pay | Admitting: Internal Medicine

## 2022-04-09 ENCOUNTER — Other Ambulatory Visit: Payer: Self-pay

## 2022-04-09 ENCOUNTER — Encounter (HOSPITAL_BASED_OUTPATIENT_CLINIC_OR_DEPARTMENT_OTHER): Payer: Self-pay | Admitting: Emergency Medicine

## 2022-04-09 DIAGNOSIS — N3000 Acute cystitis without hematuria: Secondary | ICD-10-CM | POA: Diagnosis not present

## 2022-04-09 DIAGNOSIS — R109 Unspecified abdominal pain: Secondary | ICD-10-CM | POA: Diagnosis present

## 2022-04-09 NOTE — ED Triage Notes (Addendum)
Pt c/o right flank pain with nausea x 45 mins, reports has had 1 kidney stone over 25 years ago only hx; denies urinary symptoms, last BM x 45 mins ago

## 2022-04-10 ENCOUNTER — Emergency Department (HOSPITAL_BASED_OUTPATIENT_CLINIC_OR_DEPARTMENT_OTHER): Payer: BC Managed Care – PPO

## 2022-04-10 ENCOUNTER — Emergency Department (HOSPITAL_BASED_OUTPATIENT_CLINIC_OR_DEPARTMENT_OTHER)
Admission: EM | Admit: 2022-04-10 | Discharge: 2022-04-10 | Disposition: A | Discharge status: Home / Self Care | Payer: BC Managed Care – PPO | Attending: Emergency Medicine | Admitting: Emergency Medicine

## 2022-04-10 DIAGNOSIS — N3000 Acute cystitis without hematuria: Secondary | ICD-10-CM

## 2022-04-10 LAB — CBC WITH DIFFERENTIAL/PLATELET
Abs Immature Granulocytes: 0.04 10*3/uL (ref 0.00–0.07)
Basophils Absolute: 0 10*3/uL (ref 0.0–0.1)
Basophils Relative: 0 %
Eosinophils Absolute: 0.2 10*3/uL (ref 0.0–0.5)
Eosinophils Relative: 2 %
HCT: 38.6 % (ref 36.0–46.0)
Hemoglobin: 12.8 g/dL (ref 12.0–15.0)
Immature Granulocytes: 1 %
Lymphocytes Relative: 25 %
Lymphs Abs: 1.8 10*3/uL (ref 0.7–4.0)
MCH: 26.6 pg (ref 26.0–34.0)
MCHC: 33.2 g/dL (ref 30.0–36.0)
MCV: 80.1 fL (ref 80.0–100.0)
Monocytes Absolute: 0.4 10*3/uL (ref 0.1–1.0)
Monocytes Relative: 5 %
Neutro Abs: 4.7 10*3/uL (ref 1.7–7.7)
Neutrophils Relative %: 67 %
Platelets: 246 10*3/uL (ref 150–400)
RBC: 4.82 MIL/uL (ref 3.87–5.11)
RDW: 14.1 % (ref 11.5–15.5)
WBC: 7.1 10*3/uL (ref 4.0–10.5)
nRBC: 0 % (ref 0.0–0.2)

## 2022-04-10 LAB — URINALYSIS, ROUTINE W REFLEX MICROSCOPIC
Bilirubin Urine: NEGATIVE
Glucose, UA: >1000 mg/dL — AB
Hgb urine dipstick: NEGATIVE
Leukocytes,Ua: NEGATIVE
Nitrite: NEGATIVE
Protein, ur: NEGATIVE mg/dL
Specific Gravity, Urine: 1.032 — ABNORMAL HIGH (ref 1.005–1.030)
pH: 6 (ref 5.0–8.0)

## 2022-04-10 LAB — BASIC METABOLIC PANEL
Anion gap: 12 (ref 5–15)
BUN: 16 mg/dL (ref 6–20)
CO2: 23 mmol/L (ref 22–32)
Calcium: 9.2 mg/dL (ref 8.9–10.3)
Chloride: 99 mmol/L (ref 98–111)
Creatinine, Ser: 0.66 mg/dL (ref 0.44–1.00)
GFR, Estimated: >60 mL/min (ref >60)
Glucose, Bld: 326 mg/dL — ABNORMAL HIGH (ref 70–99)
Potassium: 4.1 mmol/L (ref 3.5–5.1)
Sodium: 134 mmol/L — ABNORMAL LOW (ref 135–145)

## 2022-04-10 LAB — PREGNANCY, URINE: Preg Test, Ur: NEGATIVE

## 2022-04-10 MED ORDER — CEFTRIAXONE SODIUM 1 G IJ SOLR
1.0000 g | Freq: Once | INTRAMUSCULAR | Status: AC
  Administered 2022-04-10: 1 g via INTRAMUSCULAR
  Filled 2022-04-10: qty 10

## 2022-04-10 MED ORDER — CEPHALEXIN 500 MG PO CAPS: 500.0000 mg | ORAL_CAPSULE | Freq: Four times a day (QID) | ORAL | 0 refills | Status: AC

## 2022-04-10 MED ORDER — MAGNESIUM SULFATE 2 GM/50ML IV SOLN
2.0000 g | Freq: Once | INTRAVENOUS | Status: AC
  Administered 2022-04-10: 2 g via INTRAVENOUS
  Filled 2022-04-10: qty 50

## 2022-04-10 MED ORDER — KETOROLAC TROMETHAMINE 30 MG/ML IJ SOLN
30.0000 mg | Freq: Once | INTRAMUSCULAR | Status: AC
  Administered 2022-04-10: 30 mg via INTRAVENOUS
  Filled 2022-04-10: qty 1

## 2022-04-10 MED ORDER — SODIUM CHLORIDE 0.9 % IV BOLUS
500.0000 mL | Freq: Once | INTRAVENOUS | Status: AC
  Administered 2022-04-10: 500 mL via INTRAVENOUS

## 2022-04-10 MED ORDER — PHENAZOPYRIDINE HCL 200 MG PO TABS: 200.0000 mg | ORAL_TABLET | Freq: Three times a day (TID) | ORAL | 0 refills | Status: AC

## 2022-04-10 MED ORDER — ONDANSETRON HCL 4 MG/2ML IJ SOLN
4.0000 mg | Freq: Once | INTRAMUSCULAR | Status: AC
  Administered 2022-04-10: 4 mg via INTRAVENOUS
  Filled 2022-04-10: qty 2

## 2022-04-10 NOTE — ED Provider Notes (Addendum)
[facility] EMERGENCY DEPT Provider Note   CSN: [id number] Arrival date & time: 04/09/22  2307     History  Chief Complaint  Patient presents with   Flank Pain    [name] is a 46 y.o. female.  The history is provided by the patient.  Flank Pain The current episode started 3 to 5 hours ago. The problem occurs constantly. The problem has not changed since onset.Pertinent negatives include no chest pain, no abdominal pain, no headaches and no shortness of breath. Nothing aggravates the symptoms. Nothing relieves the symptoms. She has tried nothing for the symptoms. The treatment provided no relief.  Patient with B flank and groin pain that started tonight when she went to use the bathroom.  Dry heaving as well.        Home Medications Prior to Admission medications   Medication Sig Start Date End Date Taking? Authorizing Provider  cephALEXin (KEFLEX) 500 MG capsule Take 1 capsule (500 mg total) by mouth 4 (four) times daily. 04/10/22  Yes Caven Perine, MD  phenazopyridine (PYRIDIUM) 200 MG tablet Take 1 tablet (200 mg total) by mouth 3 (three) times daily. 04/10/22  Yes Yvonna Brun, MD  benzonatate (TESSALON) 200 MG capsule Take 1 capsule (200 mg total) by mouth 3 (three) times daily as needed for cough. MUST HAVE OV FOR FURTHER REFILLS 08/28/19   Nyoka Cowden, MD  hydrochlorothiazide (MICROZIDE) 12.5 MG capsule Take 12.5 mg by mouth daily. 08/10/18   [provider]  losartan (COZAAR) 50 MG tablet Take 50 mg by mouth daily. 08/10/18   [provider]  losartan-hydrochlorothiazide (HYZAAR) 50-12.5 MG tablet Take 1 tablet by mouth daily. 05/01/17   [provider]  meloxicam (MOBIC) 15 MG tablet Take 15 mg by mouth daily. with food 03/29/17   [provider]  ondansetron (ZOFRAN) 4 MG tablet Take 1 tablet (4 mg total) by mouth every 8 (eight) hours as needed for nausea or vomiting. Patient not taking: Reported on 09/30/2018  07/03/17   Tegeler, Canary Brim, MD  oxyCODONE-acetaminophen (PERCOCET/ROXICET) 5-325 MG tablet Take 1 tablet by mouth every 4 (four) hours as needed for severe pain. Patient not taking: Reported on 09/30/2018 07/03/17   Tegeler, Canary Brim, MD  pantoprazole (PROTONIX) 40 MG tablet Take 1 tablet (40 mg total) by mouth daily. Take 30-60 min before first meal of the day 09/30/18   Nyoka Cowden, MD  pramipexole (MIRAPEX) 0.125 MG tablet TAKE 1 TABLET FOR 7 DAYS. THEN TAKE 2 TABLETS FOR 7 DAYS. THEN TAKE 4 TABLETS DAILY. AT BEDTIME. 06/25/17   [provider]  pramipexole (MIRAPEX) 0.5 MG tablet Take 1 tablet by mouth at bedtime. 06/04/18   [provider]  valACYclovir (VALTREX) 500 MG tablet TAKE 1 TAB BY MOUTH TWICE A DAY FOR 2 DAYS AS NEEDED FOR EACH OUTBREAK 05/01/17   [provider]      Allergies    Bee venom and Lisinopril    Review of Systems   Review of Systems  Constitutional:  Negative for fever.  HENT:  Negative for facial swelling.   Eyes:  Negative for redness.  Respiratory:  Negative for shortness of breath.   Cardiovascular:  Negative for chest pain.  Gastrointestinal:  Negative for abdominal pain.  Genitourinary:  Positive for flank pain.  Neurological:  Negative for headaches.  All other systems reviewed and are negative.   Physical Exam Updated Vital Signs BP 111/72 (BP Location: Right Arm)   Pulse 98  Temp 98.2 F (36.8 C) (Oral)   Resp 18   Ht 5' (1.524 m)   Wt 99.8 kg   LMP 03/17/2022 (Approximate)   SpO2 98%   BMI 42.97 kg/m  Physical Exam Vitals and nursing note reviewed.  Constitutional:      General: She is not in acute distress.    Appearance: Normal appearance. She is well-developed.  HENT:     Head: Normocephalic and atraumatic.     Nose: Nose normal.  Eyes:     Pupils: Pupils are equal, round, and reactive to light.  Cardiovascular:     Rate and Rhythm: Normal rate and regular rhythm.     Pulses: Normal pulses.      Heart sounds: Normal heart sounds.  Pulmonary:     Effort: Pulmonary effort is normal. No respiratory distress.     Breath sounds: Normal breath sounds.  Abdominal:     General: Bowel sounds are normal. There is no distension.     Palpations: Abdomen is soft.     Tenderness: There is no abdominal tenderness. There is no guarding or rebound.  Genitourinary:    Vagina: No vaginal discharge.  Musculoskeletal:        General: Normal range of motion.     Cervical back: Neck supple.  Skin:    General: Skin is warm and dry.     Capillary Refill: Capillary refill takes less than 2 seconds.     Findings: No erythema or rash.  Neurological:     General: No focal deficit present.     Mental Status: She is alert and oriented to person, place, and time.     Deep Tendon Reflexes: Reflexes normal.  Psychiatric:        Mood and Affect: Mood normal.     ED Results / Procedures / Treatments   Labs (all labs ordered are listed, but only abnormal results are displayed) Results for orders placed or performed during the hospital encounter of 04/10/22  Urinalysis, Routine w reflex microscopic Urine, Clean Catch  Result Value Ref Range   Color, Urine COLORLESS (A) YELLOW   APPearance CLEAR CLEAR   Specific Gravity, Urine 1.032 (H) 1.005 - 1.030   pH 6.0 5.0 - 8.0   Glucose, UA >1,000 (A) NEGATIVE mg/dL   Hgb urine dipstick NEGATIVE NEGATIVE   Bilirubin Urine NEGATIVE NEGATIVE   Ketones, ur TRACE (A) NEGATIVE mg/dL   Protein, ur NEGATIVE NEGATIVE mg/dL   Nitrite NEGATIVE NEGATIVE   Leukocytes,Ua NEGATIVE NEGATIVE   RBC / HPF 0-5 0 - 5 RBC/hpf   WBC, UA 0-5 0 - 5 WBC/hpf   Bacteria, UA MANY (A) NONE SEEN   Squamous Epithelial / LPF 0-5 0 - 5   Budding Yeast PRESENT   Pregnancy, urine  Result Value Ref Range   Preg Test, Ur NEGATIVE NEGATIVE  CBC with Differential  Result Value Ref Range   WBC 7.1 4.0 - 10.5 K/uL   RBC 4.82 3.87 - 5.11 MIL/uL   Hemoglobin 12.8 12.0 - 15.0 g/dL   HCT 38.6  36.0 - 46.0 %   MCV 80.1 80.0 - 100.0 fL   MCH 26.6 26.0 - 34.0 pg   MCHC 33.2 30.0 - 36.0 g/dL   RDW 14.1 11.5 - 15.5 %   Platelets 246 150 - 400 K/uL   nRBC 0.0 0.0 - 0.2 %   Neutrophils Relative % 67 %   Neutro Abs 4.7 1.7 - 7.7 K/uL   Lymphocytes Relative 25 %  Lymphs Abs 1.8 0.7 - 4.0 K/uL   Monocytes Relative 5 %   Monocytes Absolute 0.4 0.1 - 1.0 K/uL   Eosinophils Relative 2 %   Eosinophils Absolute 0.2 0.0 - 0.5 K/uL   Basophils Relative 0 %   Basophils Absolute 0.0 0.0 - 0.1 K/uL   Immature Granulocytes 1 %   Abs Immature Granulocytes 0.04 0.00 - 0.07 K/uL  Basic metabolic panel  Result Value Ref Range   Sodium 134 (L) 135 - 145 mmol/L   Potassium 4.1 3.5 - 5.1 mmol/L   Chloride 99 98 - 111 mmol/L   CO2 23 22 - 32 mmol/L   Glucose, Bld 326 (H) 70 - 99 mg/dL   BUN 16 6 - 20 mg/dL   Creatinine, Ser 0.66 0.44 - 1.00 mg/dL   Calcium 9.2 8.9 - 10.3 mg/dL   GFR, Estimated >60 >60 mL/min   Anion gap 12 5 - 15   CT Renal Stone Study  Result Date: 04/10/2022 CLINICAL DATA:  Abdominal and flank pain with stone suspected. Pain is right-sided EXAM: CT ABDOMEN AND PELVIS WITHOUT CONTRAST TECHNIQUE: Multidetector CT imaging of the abdomen and pelvis was performed following the standard protocol without IV contrast. RADIATION DOSE REDUCTION: This exam was performed according to the departmental dose-optimization program which includes automated exposure control, adjustment of the mA and/or kV according to patient size and/or use of iterative reconstruction technique. COMPARISON:  07/03/2017 FINDINGS: Lower chest:  No contributory findings. Hepatobiliary: Hepatic steatosis. No focal abnormality.No evidence of biliary obstruction or stone. Pancreas: Unremarkable. Spleen: Unremarkable. Adrenals/Urinary Tract: Negative adrenals. No hydronephrosis or ureteral stone. 4 mm nonobstructing right nephrolithiasis. Unremarkable bladder. Stomach/Bowel:  No obstruction. No appendicitis.  Vascular/Lymphatic: No acute vascular abnormality. No mass or adenopathy. Reproductive:No pathologic findings. Other: No ascites or pneumoperitoneum. Musculoskeletal: No acute abnormalities. IMPRESSION: 1. No acute finding. 2. Hepatic steatosis and nonobstructing right nephrolithiasis. Electronically Signed   By: Jorje Guild M.D.   On: 04/10/2022 04:32    None  Radiology CT Renal Stone Study  Result Date: 04/10/2022 CLINICAL DATA:  Abdominal and flank pain with stone suspected. Pain is right-sided EXAM: CT ABDOMEN AND PELVIS WITHOUT CONTRAST TECHNIQUE: Multidetector CT imaging of the abdomen and pelvis was performed following the standard protocol without IV contrast. RADIATION DOSE REDUCTION: This exam was performed according to the departmental dose-optimization program which includes automated exposure control, adjustment of the mA and/or kV according to patient size and/or use of iterative reconstruction technique. COMPARISON:  07/03/2017 FINDINGS: Lower chest:  No contributory findings. Hepatobiliary: Hepatic steatosis. No focal abnormality.No evidence of biliary obstruction or stone. Pancreas: Unremarkable. Spleen: Unremarkable. Adrenals/Urinary Tract: Negative adrenals. No hydronephrosis or ureteral stone. 4 mm nonobstructing right nephrolithiasis. Unremarkable bladder. Stomach/Bowel:  No obstruction. No appendicitis. Vascular/Lymphatic: No acute vascular abnormality. No mass or adenopathy. Reproductive:No pathologic findings. Other: No ascites or pneumoperitoneum. Musculoskeletal: No acute abnormalities. IMPRESSION: 1. No acute finding. 2. Hepatic steatosis and nonobstructing right nephrolithiasis. Electronically Signed   By: Jorje Guild M.D.   On: 04/10/2022 04:32    Procedures Procedures    Medications Ordered in ED Medications  ketorolac (TORADOL) 30 MG/ML injection 30 mg (30 mg Intravenous Given 04/10/22 0328)  ondansetron (ZOFRAN) injection 4 mg (4 mg Intravenous Given 04/10/22  0327)  magnesium sulfate IVPB 2 g 50 mL (0 g Intravenous Stopped 04/10/22 0403)  sodium chloride 0.9 % bolus 500 mL (0 mLs Intravenous Stopped 04/10/22 0444)  cefTRIAXone (ROCEPHIN) injection 1 g (1 g Intramuscular Given 04/10/22 0444)  ED Course/ Medical Decision Making/ A&P                           Medical Decision Making Patient with B flank pain this evening   Amount and/or Complexity of Data Reviewed Independent Historian: spouse    Details: See above  External Data Reviewed: notes.    Details: Previous notes reviewed  Labs: ordered.    Details: Urine pregnancy is negative. Patient's urine is consistent with UTI.  Normal white count 7.1, normal hemoglobin 12.8, normal platelet count.  Sodium slight low 134, normal potassium 4.1, normal creatinine.    Radiology: ordered and independent interpretation performed.    Details: No stones seen on CT by me   Risk Prescription drug management. Risk Details: Well appearing, normal exam and vitals. Treated for UTi in the ED. Stable for discharge.  Strict return     Final Clinical Impression(s) / ED Diagnoses Final diagnoses:  Acute cystitis without hematuria   Return for intractable cough, coughing up blood, fevers > 100.4 unrelieved by medication, shortness of breath, intractable vomiting, chest pain, shortness of breath, weakness, numbness, changes in speech, facial asymmetry, abdominal pain, passing out, Inability to tolerate liquids or food, cough, altered mental status or any concerns. No signs of systemic illness or infection. The patient is nontoxic-appearing on exam and vital signs are within normal limits.  I have reviewed the triage vital signs and the nursing notes. Pertinent labs & imaging results that were available during my care of the patient were reviewed by me and considered in my medical decision making (see chart for details). After history, exam, and medical workup I feel the patient has been appropriately medically  screened and is safe for discharge home. Pertinent diagnoses were discussed with the patient. Patient was given return precautions.    Rx / DC Orders ED Discharge Orders          Ordered    cephALEXin (KEFLEX) 500 MG capsule  4 times daily        04/10/22 0442    phenazopyridine (PYRIDIUM) 200 MG tablet  3 times daily        04/10/22 0442                 Klayten Jolliff, MD 04/10/22 [id number]
